# Patient Record
Sex: Female | Born: 1990 | Race: Black or African American | Hispanic: No | Marital: Single | Smoking: Never smoker
Health system: Southern US, Community
[De-identification: ages and names within clinical notes are randomized; demographics above are authoritative.]

---

## 2014-01-12 ENCOUNTER — Emergency Department (HOSPITAL_COMMUNITY): Payer: Self-pay

## 2014-01-12 ENCOUNTER — Emergency Department (HOSPITAL_COMMUNITY)
Admission: EM | Admit: 2014-01-12 | Discharge: 2014-01-12 | Disposition: A | Discharge status: Home / Self Care | Payer: Self-pay | Attending: Emergency Medicine | Admitting: Emergency Medicine

## 2014-01-12 ENCOUNTER — Encounter (HOSPITAL_COMMUNITY): Payer: Self-pay | Admitting: Emergency Medicine

## 2014-01-12 DIAGNOSIS — W2209XA Striking against other stationary object, initial encounter: Secondary | ICD-10-CM | POA: Insufficient documentation

## 2014-01-12 DIAGNOSIS — S93602A Unspecified sprain of left foot, initial encounter: Secondary | ICD-10-CM

## 2014-01-12 DIAGNOSIS — S8990XA Unspecified injury of unspecified lower leg, initial encounter: Secondary | ICD-10-CM | POA: Insufficient documentation

## 2014-01-12 DIAGNOSIS — S93609A Unspecified sprain of unspecified foot, initial encounter: Secondary | ICD-10-CM | POA: Insufficient documentation

## 2014-01-12 DIAGNOSIS — S99929A Unspecified injury of unspecified foot, initial encounter: Secondary | ICD-10-CM

## 2014-01-12 DIAGNOSIS — Y9289 Other specified places as the place of occurrence of the external cause: Secondary | ICD-10-CM | POA: Insufficient documentation

## 2014-01-12 DIAGNOSIS — Y9389 Activity, other specified: Secondary | ICD-10-CM | POA: Insufficient documentation

## 2014-01-12 DIAGNOSIS — S99919A Unspecified injury of unspecified ankle, initial encounter: Secondary | ICD-10-CM

## 2014-01-12 MED ORDER — IBUPROFEN 400 MG PO TABS
400.0000 mg | ORAL_TABLET | Freq: Once | ORAL | Status: AC
  Administered 2014-01-12: 400 mg via ORAL
  Filled 2014-01-12: qty 1

## 2014-01-12 MED ORDER — HYDROCODONE-ACETAMINOPHEN 5-325 MG PO TABS
1.0000 | ORAL_TABLET | Freq: Once | ORAL | Status: DC
  Filled 2014-01-12: qty 1

## 2014-01-12 MED ORDER — NAPROXEN SODIUM 550 MG PO TABS: 550.0000 mg | ORAL_TABLET | Freq: Two times a day (BID) | ORAL | Status: AC | PRN

## 2014-01-12 MED ORDER — METHOCARBAMOL 500 MG PO TABS: 500.0000 mg | ORAL_TABLET | Freq: Two times a day (BID) | ORAL | Status: AC

## 2014-01-12 NOTE — Discharge Instructions (Signed)
Foot Sprain The muscles and cord like structures which attach muscle to bone (tendons) that surround the feet are made up of units. A foot sprain can occur at the weakest spot in any of these units. This condition is most often caused by injury to or overuse of the foot, as from playing contact sports, or aggravating a previous injury, or from poor conditioning, or obesity. SYMPTOMS  Pain with movement of the foot.  Tenderness and swelling at the injury site.  Loss of strength is present in moderate or severe sprains. THE THREE GRADES OR SEVERITY OF FOOT SPRAIN ARE:  Mild (Grade I): Slightly pulled muscle without tearing of muscle or tendon fibers or loss of strength.  Moderate (Grade II): Tearing of fibers in a muscle, tendon, or at the attachment to bone, with small decrease in strength.  Severe (Grade III): Rupture of the muscle-tendon-bone attachment, with separation of fibers. Severe sprain requires surgical repair. Often repeating (chronic) sprains are caused by overuse. Sudden (acute) sprains are caused by direct injury or over-use. DIAGNOSIS  Diagnosis of this condition is usually by your own observation. If problems continue, a caregiver may be required for further evaluation and treatment. X-rays may be required to make sure there are not breaks in the bones (fractures) present. Continued problems may require physical therapy for treatment. PREVENTION  Use strength and conditioning exercises appropriate for your sport.  Warm up properly prior to working out.  Use athletic shoes that are made for the sport you are participating in.  Allow adequate time for healing. Early return to activities makes repeat injury more likely, and can lead to an unstable arthritic foot that can result in prolonged disability. Mild sprains generally heal in 3 to 10 days, with moderate and severe sprains taking 2 to 10 weeks. Your caregiver can help you determine the proper time required for  healing. HOME CARE INSTRUCTIONS   Apply ice to the injury for 15-20 minutes, 03-04 times per day. Put the ice in a plastic bag and place a towel between the bag of ice and your skin.  An elastic wrap (like an Ace bandage) may be used to keep swelling down.  Keep foot above the level of the heart, or at least raised on a footstool, when swelling and pain are present.  Try to avoid use other than gentle range of motion while the foot is painful. Do not resume use until instructed by your caregiver. Then begin use gradually, not increasing use to the point of pain. If pain does develop, decrease use and continue the above measures, gradually increasing activities that do not cause discomfort, until you gradually achieve normal use.  Use crutches if and as instructed, and for the length of time instructed.  Keep injured foot and ankle wrapped between treatments.  Massage foot and ankle for comfort and to keep swelling down. Massage from the toes up towards the knee.  Only take over-the-counter or prescription medicines for pain, discomfort, or fever as directed by your caregiver. SEEK IMMEDIATE MEDICAL CARE IF:   Your pain and swelling increase, or pain is not controlled with medications.  You have loss of feeling in your foot or your foot turns cold or blue.  You develop new, unexplained symptoms, or an increase of the symptoms that brought you to your caregiver. MAKE SURE YOU:   Understand these instructions.  Will watch your condition.  Will get help right away if you are not doing well or get worse. Document Released:   10/19/2001 Document Revised: 07/22/2011 Document Reviewed: 12/17/2007 [employer] Patient Information 2015 [employer]. This information is not intended to replace advice given to you by your health care provider. Make sure you discuss any questions you have with your health care provider.  

## 2014-01-12 NOTE — ED Notes (Signed)
Pt presents for evaluation of left foot swelling and tenderness.  Pt reports "working out hard on it" for the past 3 days and then tripping off a curb tonight.  Pedal pulse strong, swelling noted to foot.  Pt ambulatory from triage.

## 2014-01-12 NOTE — ED Provider Notes (Signed)
Medical screening examination/treatment/procedure(s) were performed by non-physician practitioner and as supervising physician I was immediately available for consultation/collaboration.   EKG Interpretation None       Olivia Mackie, MD 01/12/14 [phone]

## 2014-01-12 NOTE — ED Provider Notes (Signed)
CSN: [id number]     Arrival date & time 01/12/14  0034 History   First MD Initiated Contact with Patient 01/12/14 0050     Chief Complaint  Patient presents with  . Foot Pain     (Consider location/radiation/quality/duration/timing/severity/associated sxs/prior Treatment) HPI  23 year old female presents c/o L foot pain.  Pt sts she has been having mild L foot discomfort for the past few days.  sts "i have been working out hard and have been abusing my feet".  Only mild discomfort to both feet but today she accidentally striking L foot against the curb around 9pm.  Since then she has had increasing pain to L foot, sharp, throbbing and associated swelling.  She tries heat/ice, NSAIDs with some improvement.  However, pain is now keeping her awake. Denies numbness, ankle or knee pain.  No other complaints. Able to ambulate.     History reviewed. No pertinent past medical history. History reviewed. No pertinent past surgical history. No family history on file. History  Substance Use Topics  . Smoking status: Never Smoker   . Smokeless tobacco: Not on file  . Alcohol Use: No   OB History   Grav Para Term Preterm Abortions TAB SAB Ect Mult Living                 Review of Systems  Constitutional: Negative for fever.  Musculoskeletal: Positive for arthralgias.  Skin: Negative for rash and wound.  Neurological: Negative for numbness.      Allergies  Peanut-containing drug products  Home Medications   Prior to Admission medications   Medication Sig Start Date End Date Taking? Authorizing Provider  naproxen sodium (ANAPROX) 220 MG tablet Take 220 mg by mouth 2 (two) times daily as needed (for pain).   Yes Historical Provider, MD   BP 134/93  Pulse 80  Temp(Src) 98.1 F (36.7 C) (Oral)  Resp 20  Ht  (1.702 m)  Wt 240 lb (108.863 kg)  BMI 37.58 kg/m2  SpO2 99% Physical Exam  Nursing note and vitals reviewed. Constitutional: She appears well-developed and  well-nourished. No distress.  HENT:  Head: Atraumatic.  Eyes: Conjunctivae are normal.  Neck: Neck supple.  Musculoskeletal: She exhibits tenderness (L foot: tenderness to proximal medial foot on palpation with small palpable cord, no erythema no rash and no deformity.  L ankle with FROM, pedal pulse palpable, brisk cap refill.  ).  Neurological: She is alert.  Skin: No rash noted.  Psychiatric: She has a normal mood and affect.    ED Course  Procedures (including critical care time)  1:09 AM L foot injury, xray ordered.  Liikely a sprain.    1:46 AM Xray neg for acute fx.  ACE wrap applied.  Rice therapy discussed, ortho referral as needed.  Stable for discharge.  Pt able to ambulate.  NVI.  Labs Review Labs Reviewed - No data to display  Imaging Review Dg Foot Complete Left  01/12/2014   CLINICAL DATA:  FOOT PAIN a  EXAM: LEFT FOOT - COMPLETE 3+ VIEW  COMPARISON:  None.  FINDINGS: There is no evidence of fracture or dislocation. There is no evidence of arthropathy or other focal bone abnormality. Soft tissues are unremarkable.  IMPRESSION: Negative.   Electronically Signed   By: Oley Balm M.D.   On: 01/12/2014 01:41     EKG Interpretation None      MDM   Final diagnoses:  Foot sprain, left, initial encounter  BP 134/93  Pulse 80  Temp(Src) 98.1 F (36.7 C) (Oral)  Resp 20  Ht  (1.702 m)  Wt 240 lb (108.863 kg)  BMI 37.58 kg/m2  SpO2 99%  LMP 12/12/2013  I have reviewed nursing notes and vital signs. I personally reviewed the imaging tests through PACS system  I reviewed available ER/hospitalization records thought the EMR     Fayrene Helper, [place] 01/12/14 0147

## 2014-05-19 ENCOUNTER — Ambulatory Visit: Payer: Self-pay

## 2014-08-15 ENCOUNTER — Emergency Department (HOSPITAL_COMMUNITY)
Admission: EM | Admit: 2014-08-15 | Discharge: 2014-08-15 | Disposition: A | Discharge status: Home / Self Care | Payer: Self-pay | Attending: Emergency Medicine | Admitting: Emergency Medicine

## 2014-08-15 ENCOUNTER — Emergency Department (HOSPITAL_COMMUNITY): Payer: Self-pay

## 2014-08-15 ENCOUNTER — Encounter (HOSPITAL_COMMUNITY): Payer: Self-pay

## 2014-08-15 DIAGNOSIS — Z791 Long term (current) use of non-steroidal anti-inflammatories (NSAID): Secondary | ICD-10-CM | POA: Insufficient documentation

## 2014-08-15 DIAGNOSIS — R1110 Vomiting, unspecified: Secondary | ICD-10-CM | POA: Insufficient documentation

## 2014-08-15 DIAGNOSIS — N939 Abnormal uterine and vaginal bleeding, unspecified: Secondary | ICD-10-CM

## 2014-08-15 DIAGNOSIS — N938 Other specified abnormal uterine and vaginal bleeding: Secondary | ICD-10-CM | POA: Insufficient documentation

## 2014-08-15 DIAGNOSIS — R1031 Right lower quadrant pain: Secondary | ICD-10-CM | POA: Insufficient documentation

## 2014-08-15 LAB — COMPREHENSIVE METABOLIC PANEL
ALT: 20 U/L (ref 0–35)
ANION GAP: 8 (ref 5–15)
AST: 21 U/L (ref 0–37)
Albumin: 3.6 g/dL (ref 3.5–5.2)
Alkaline Phosphatase: 45 U/L (ref 39–117)
BILIRUBIN TOTAL: 0.5 mg/dL (ref 0.3–1.2)
BUN: 11 mg/dL (ref 6–23)
CALCIUM: 9.1 mg/dL (ref 8.4–10.5)
CO2: 23 mmol/L (ref 19–32)
CREATININE: 0.86 mg/dL (ref 0.50–1.10)
Chloride: 109 mmol/L (ref 96–112)
GFR calc Af Amer: >90 mL/min (ref >90)
Glucose, Bld: 135 mg/dL — ABNORMAL HIGH (ref 70–99)
Potassium: 4.1 mmol/L (ref 3.5–5.1)
Sodium: 140 mmol/L (ref 135–145)
Total Protein: 6.8 g/dL (ref 6.0–8.3)

## 2014-08-15 LAB — CBC WITH DIFFERENTIAL/PLATELET
Basophils Absolute: 0 10*3/uL (ref 0.0–0.1)
Basophils Relative: 0 % (ref 0–1)
EOS PCT: 3 % (ref 0–5)
Eosinophils Absolute: 0.2 10*3/uL (ref 0.0–0.7)
HCT: 41.2 % (ref 36.0–46.0)
Hemoglobin: 14.1 g/dL (ref 12.0–15.0)
LYMPHS PCT: 28 % (ref 12–46)
Lymphs Abs: 2.6 10*3/uL (ref 0.7–4.0)
MCH: 29.4 pg (ref 26.0–34.0)
MCHC: 34.2 g/dL (ref 30.0–36.0)
MCV: 85.8 fL (ref 78.0–100.0)
MONO ABS: 0.6 10*3/uL (ref 0.1–1.0)
MONOS PCT: 6 % (ref 3–12)
NEUTROS ABS: 6.1 10*3/uL (ref 1.7–7.7)
Neutrophils Relative %: 63 % (ref 43–77)
Platelets: 198 10*3/uL (ref 150–400)
RBC: 4.8 MIL/uL (ref 3.87–5.11)
RDW: 12.4 % (ref 11.5–15.5)
WBC: 9.5 10*3/uL (ref 4.0–10.5)

## 2014-08-15 LAB — URINE MICROSCOPIC-ADD ON

## 2014-08-15 LAB — PREGNANCY, URINE: Preg Test, Ur: NEGATIVE

## 2014-08-15 LAB — WET PREP, GENITAL
TRICH WET PREP: NONE SEEN
YEAST WET PREP: NONE SEEN

## 2014-08-15 LAB — URINALYSIS, ROUTINE W REFLEX MICROSCOPIC
Bilirubin Urine: NEGATIVE
Glucose, UA: NEGATIVE mg/dL
Ketones, ur: NEGATIVE mg/dL
LEUKOCYTES UA: NEGATIVE
Nitrite: NEGATIVE
Protein, ur: NEGATIVE mg/dL
SPECIFIC GRAVITY, URINE: 1.026 (ref 1.005–1.030)
Urobilinogen, UA: 1 mg/dL (ref 0.0–1.0)
pH: 6 (ref 5.0–8.0)

## 2014-08-15 LAB — GC/CHLAMYDIA PROBE AMP (~~LOC~~) NOT AT ARMC
Chlamydia: NEGATIVE
Neisseria Gonorrhea: NEGATIVE

## 2014-08-15 LAB — HIV ANTIBODY (ROUTINE TESTING W REFLEX): HIV SCREEN 4TH GENERATION: NONREACTIVE

## 2014-08-15 MED ORDER — ONDANSETRON 4 MG PO TBDP
4.0000 mg | ORAL_TABLET | Freq: Once | ORAL | Status: AC
  Administered 2014-08-15: 4 mg via ORAL
  Filled 2014-08-15: qty 1

## 2014-08-15 MED ORDER — SODIUM CHLORIDE 0.9 % IV BOLUS (SEPSIS)
1000.0000 mL | Freq: Once | INTRAVENOUS | Status: AC
  Administered 2014-08-15: 1000 mL via INTRAVENOUS

## 2014-08-15 NOTE — Discharge Instructions (Signed)
Ibuprofen 600 mg every 6 hours as needed for pain.  We will call you if your cultures indicate you require further treatment.  Return to the emergency department if your symptoms significantly worsen or change. Be sure to follow-up with your primary Dr. in the next week if not improving.   Abdominal Pain Many things can cause abdominal pain. Usually, abdominal pain is not caused by a disease and will improve without treatment. It can often be observed and treated at home. Your health care provider will do a physical exam and possibly order blood tests and X-rays to help determine the seriousness of your pain. However, in many cases, more time must pass before a clear cause of the pain can be found. Before that point, your health care provider may not know if you need more testing or further treatment. HOME CARE INSTRUCTIONS  Monitor your abdominal pain for any changes. The following actions may help to alleviate any discomfort you are experiencing:  Only take over-the-counter or prescription medicines as directed by your health care provider.  Do not take laxatives unless directed to do so by your health care provider.  Try a clear liquid diet (broth, tea, or water) as directed by your health care provider. Slowly move to a bland diet as tolerated. SEEK MEDICAL CARE IF:  You have unexplained abdominal pain.  You have abdominal pain associated with nausea or diarrhea.  You have pain when you urinate or have a bowel movement.  You experience abdominal pain that wakes you in the night.  You have abdominal pain that is worsened or improved by eating food.  You have abdominal pain that is worsened with eating fatty foods.  You have a fever. SEEK IMMEDIATE MEDICAL CARE IF:   Your pain does not go away within 2 hours.  You keep throwing up (vomiting).  Your pain is felt only in portions of the abdomen, such as the right side or the left lower portion of the abdomen.  You pass bloody  or black tarry stools. MAKE SURE YOU:  Understand these instructions.   Will watch your condition.   Will get help right away if you are not doing well or get worse.  Document Released: 02/06/2005 Document Revised: 05/04/2013 Document Reviewed: 01/06/2013 [facility] Patient Information 2015 [place], [place]. This information is not intended to replace advice given to you by your health care provider. Make sure you discuss any questions you have with your health care provider.  Dysfunctional Uterine Bleeding Normally, menstrual periods begin between ages 5911 to 6417 in young women. A normal menstrual cycle/period may begin every 23 days up to 35 days and lasts from 1 to 7 days. Around 12 to 14 days before your menstrual period starts, ovulation (ovary produces an egg) occurs. When counting the time between menstrual periods, count from the first day of bleeding of the previous period to the first day of bleeding of the next period. Dysfunctional (abnormal) uterine bleeding is bleeding that is different from a normal menstrual period. Your periods may come earlier or later than usual. They may be lighter, have blood clots or be heavier. You may have bleeding between periods, or you may skip one period or more. You may have bleeding after sexual intercourse, bleeding after menopause, or no menstrual period. CAUSES   Pregnancy (normal, miscarriage, tubal).  IUDs (intrauterine device, birth control).  Birth control pills.  Hormone treatment.  Menopause.  Infection of the cervix.  Blood clotting problems.  Infection of the inside  lining of the uterus.  Endometriosis, inside lining of the uterus growing in the pelvis and other female organs.  Adhesions (scar tissue) inside the uterus.  Obesity or severe weight loss.  Uterine polyps inside the uterus.  Cancer of the vagina, cervix, or uterus.  Ovarian cysts or polycystic ovary syndrome.  Medical problems (diabetes, thyroid  disease).  Uterine fibroids (noncancerous tumor).  Problems with your female hormones.  Endometrial hyperplasia, very thick lining and enlarged cells inside of the uterus.  Medicines that interfere with ovulation.  Radiation to the pelvis or abdomen.  Chemotherapy. DIAGNOSIS   Your doctor will discuss the history of your menstrual periods, medicines you are taking, changes in your weight, stress in your life, and any medical problems you may have.  Your doctor will do a physical and pelvic examination.  Your doctor may want to perform certain tests to make a diagnosis, such as:  Pap test.  Blood tests.  Cultures for infection.  CT scan.  Ultrasound.  Hysteroscopy.  Laparoscopy.  MRI.  Hysterosalpingography.  D and C.  Endometrial biopsy. TREATMENT  Treatment will depend on the cause of the dysfunctional uterine bleeding (DUB). Treatment may include:  Observing your menstrual periods for a couple of months.  Prescribing medicines for medical problems, including:  Antibiotics.  Hormones.  Birth control pills.  Removing an IUD (intrauterine device, birth control).  Surgery:  D and C (scrape and remove tissue from inside the uterus).  Laparoscopy (examine inside the abdomen with a lighted tube).  Uterine ablation (destroy lining of the uterus with electrical current, laser, heat, or freezing).  Hysteroscopy (examine cervix and uterus with a lighted tube).  Hysterectomy (remove the uterus). HOME CARE INSTRUCTIONS   If medicines were prescribed, take exactly as directed. Do not change or switch medicines without consulting your caregiver.  Long term heavy bleeding may result in iron deficiency. Your caregiver may have prescribed iron pills. They help replace the iron that your body lost from heavy bleeding. Take exactly as directed.  Do not take aspirin or medicines that contain aspirin one week before or during your menstrual period. Aspirin may  make the bleeding worse.  If you need to change your sanitary pad or tampon more than once every 2 hours, stay in bed with your feet elevated and a cold pack on your lower abdomen. Rest as much as possible, until the bleeding stops or slows down.  Eat well-balanced meals. Eat foods high in iron. Examples are:  Leafy green vegetables.  Whole-grain breads and cereals.  Eggs.  Meat.  Liver.  Do not try to lose weight until the abnormal bleeding has stopped and your blood iron level is back to normal. Do not lift more than ten pounds or do strenuous activities when you are bleeding.  For a couple of months, make note on your calendar, marking the start and ending of your period, and the type of bleeding (light, medium, heavy, spotting, clots or missed periods). This is for your caregiver to better evaluate your problem. SEEK MEDICAL CARE IF:   You develop nausea (feeling sick to your stomach) and vomiting, dizziness, or diarrhea while you are taking your medicine.  You are getting lightheaded or weak.  You have any problems that may be related to the medicine you are taking.  You develop pain with your DUB.  You want to remove your IUD.  You want to stop or change your birth control pills or hormones.  You have any type of  abnormal bleeding mentioned above.  You are over 70 years old and have not had a menstrual period yet.  You are 24 years old and you are still having menstrual periods.  You have any of the symptoms mentioned above.  You develop a rash. SEEK IMMEDIATE MEDICAL CARE IF:   An oral temperature above 102 F (38.9 C) develops.  You develop chills.  You are changing your sanitary pad or tampon more than once an hour.  You develop abdominal pain.  You pass out or faint. Document Released: 04/26/2000 Document Revised: 07/22/2011 Document Reviewed: 03/28/2009 [facility] Patient Information 2015 [place], [place]. This information is not intended to replace  advice given to you by your health care provider. Make sure you discuss any questions you have with your health care provider.

## 2014-08-15 NOTE — ED Notes (Signed)
Returned from ultrasound.

## 2014-08-15 NOTE — ED Notes (Signed)
Pt c/o of new onset emesis x 2 ~ 3am. Reports 1 episode of "coughing up blood." Pain in the lower R abdomen and one episode of vaginal bleeding tonight.

## 2014-08-15 NOTE — ED Notes (Signed)
Patient transported to Ultrasound 

## 2014-08-15 NOTE — ED Provider Notes (Signed)
CSN: [id number]     Arrival date & time 08/15/14  0431 History   First MD Initiated Contact with Patient 08/15/14 0440     Chief Complaint  Patient presents with  . Hemoptysis  . Vaginal Bleeding     (Consider location/radiation/quality/duration/timing/severity/associated sxs/prior Treatment) HPI Comments: Patient is a 24 year old female with history of uterine fibroids. She presents for evaluation of vaginal bleeding and right lower quadrant pain which was present upon waking from sleep this morning approximately 30 minutes ago. She reports one episode of vomiting. She denies any fevers or chills. She is sexually active with one partner with whom she has been in a monogamous relationship with.  Patient is a 24 y.o. female presenting with vaginal bleeding. The history is provided by the patient.  Vaginal Bleeding Quality:  Dark red Severity:  Moderate Onset quality:  Sudden Duration: 30 minutes ago. Timing:  Constant Progression:  Partially resolved Chronicity:  New Menstrual history:  Regular Possible pregnancy: yes   Relieved by:  Nothing Worsened by:  Nothing tried Ineffective treatments:  None tried Associated symptoms: abdominal pain and nausea   Associated symptoms: no dysuria and no fever     History reviewed. No pertinent past medical history. History reviewed. No pertinent past surgical history. History reviewed. No pertinent family history. History  Substance Use Topics  . Smoking status: Never Smoker   . Smokeless tobacco: Not on file  . Alcohol Use: No   OB History    No data available     Review of Systems  Constitutional: Negative for fever.  Gastrointestinal: Positive for nausea and abdominal pain.  Genitourinary: Positive for vaginal bleeding. Negative for dysuria.  All other systems reviewed and are negative.     Allergies  Peanut-containing drug products  Home Medications   Prior to Admission medications   Medication Sig Start Date End Date  Taking? Authorizing Provider  methocarbamol (ROBAXIN) 500 MG tablet Take 1 tablet (500 mg total) by mouth 2 (two) times daily. 01/12/14   Fayrene Helper, PA-C  naproxen sodium (ANAPROX) 550 MG tablet Take 1 tablet (550 mg total) by mouth 2 (two) times daily as needed (for pain). 01/12/14   Fayrene Helper, PA-C   There were no vitals taken for this visit. Physical Exam  Constitutional: She is oriented to person, place, and time. She appears well-developed and well-nourished. No distress.  HENT:  Head: Normocephalic and atraumatic.  Neck: Normal range of motion. Neck supple.  Cardiovascular: Normal rate and regular rhythm.  Exam reveals no gallop and no friction rub.   No murmur heard. Pulmonary/Chest: Effort normal and breath sounds normal. No respiratory distress. She has no wheezes.  Abdominal: Soft. Bowel sounds are normal. She exhibits no distension. There is tenderness. There is no rebound and no guarding.  There is tenderness to palpation in the right lower quadrant.  Genitourinary: Uterus normal. Vaginal discharge found.  There is a slight bloody discharge present. There are no adnexal masses or tenderness. There is no cervical motion tenderness.  Musculoskeletal: Normal range of motion.  Neurological: She is alert and oriented to person, place, and time.  Skin: Skin is warm and dry. She is not diaphoretic.  Nursing note and vitals reviewed.   ED Course  Procedures (including critical care time) Labs Review Labs Reviewed  WET PREP, GENITAL  URINALYSIS, ROUTINE W REFLEX MICROSCOPIC  PREGNANCY, URINE  COMPREHENSIVE METABOLIC PANEL  CBC WITH DIFFERENTIAL/PLATELET  HIV ANTIBODY (ROUTINE TESTING)  GC/CHLAMYDIA PROBE AMP ([place])  Imaging Review No results found.   EKG Interpretation None      MDM   Final diagnoses:  None    Patient presents with lower abdominal discomfort, vaginal bleeding, and cough that started approximately 30 minutes prior to arrival. Her abdomen is  very benign and workup is very unremarkable. As she presented here with a very short duration of symptoms and she appears very comfortable, I am reluctant to perform any further imaging studies. Her ultrasound reveals nothing significant and I believe her to be appropriate for discharge.    Geoffery Lyonsouglas Daril Warga, MD 08/16/14 [phone]

## 2016-05-26 IMAGING — US US PELVIS COMPLETE
1 series · 14 of 25 positions shown · non-contrast
Comparison: None

CLINICAL DATA: Vomiting, RIGHT lower quadrant pain. Last menstrual
period August 05, 2014

EXAM:
TRANSABDOMINAL AND TRANSVAGINAL ULTRASOUND OF PELVIS
TECHNIQUE: Both transabdominal and transvaginal ultrasound examinations of the
pelvis were performed. Transabdominal technique was performed for
global imaging of the pelvis including uterus, ovaries, adnexal
regions, and pelvic cul-de-sac. It was necessary to proceed with
endovaginal exam following the transabdominal exam to visualize the
endometrium and ovaries.

[Series 1: us pelvis complete · 0.21mm/px · 14 of 75 slices shown]
[im 1/75]
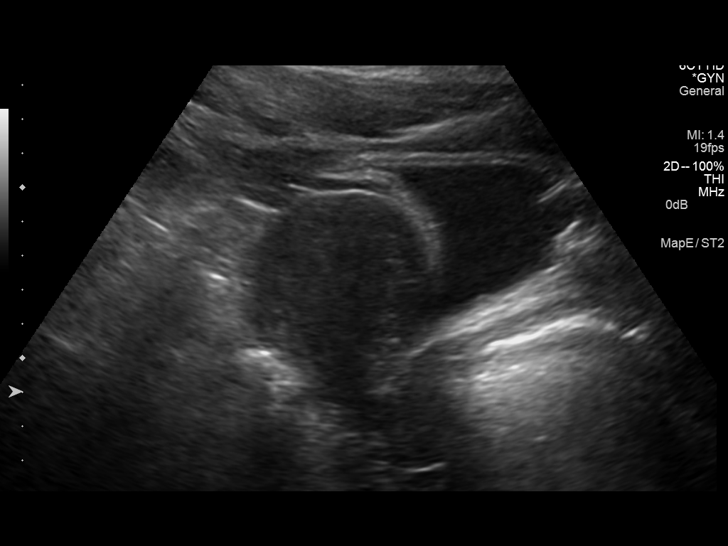
[im 7/75]
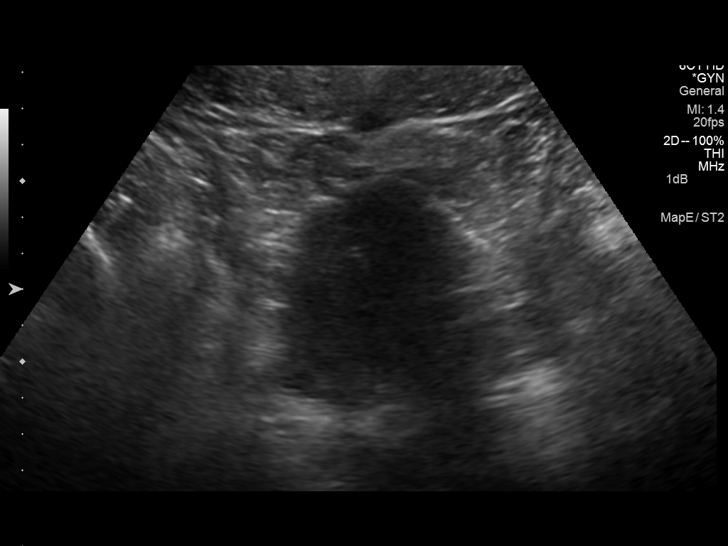
[im 13/75]
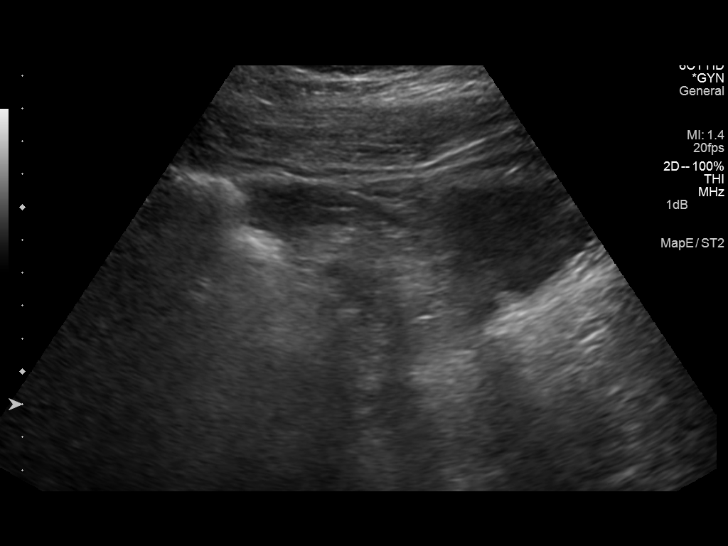
[im 19/75]
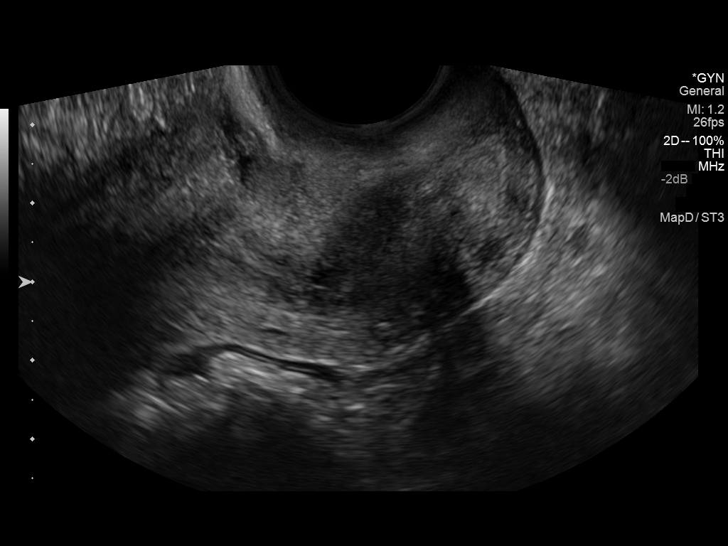
[im 25/75]
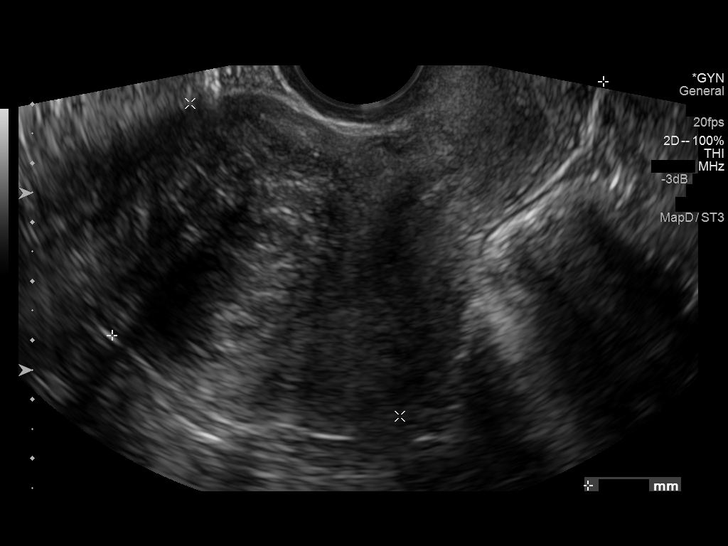
[im 28/75]
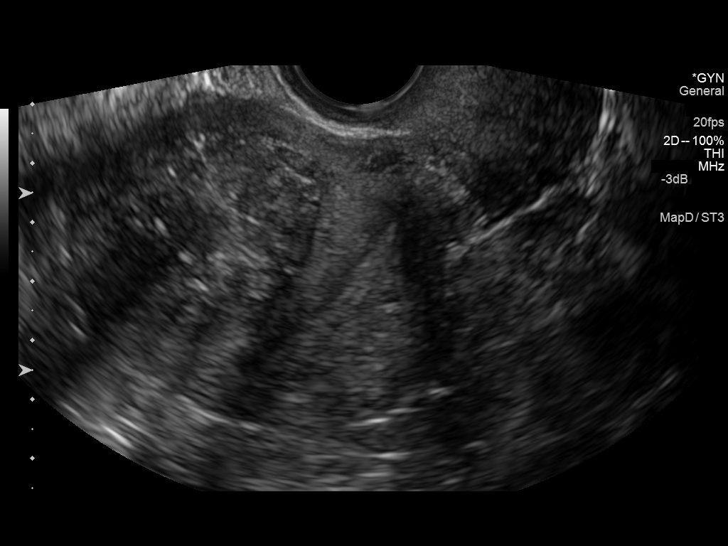
[im 34/75]
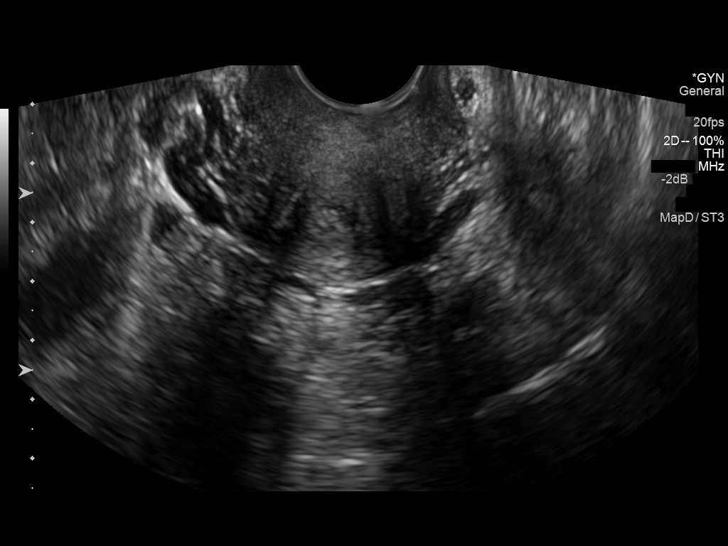
[im 41/75]
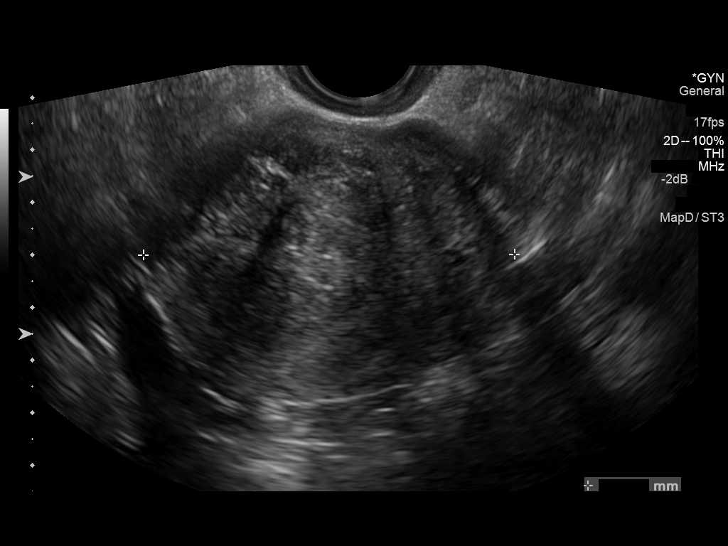
[im 47/75]
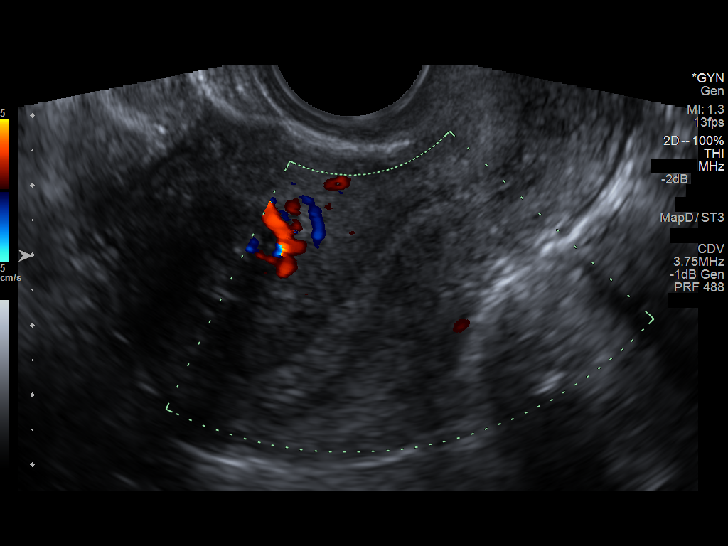
[im 50/75]
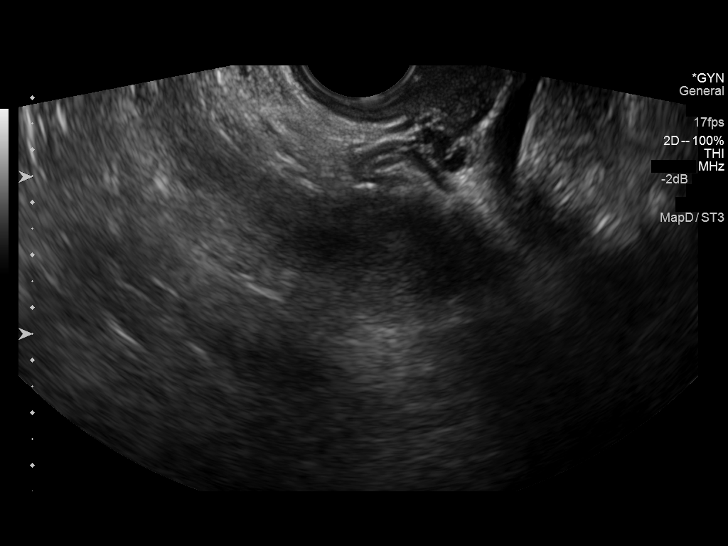
[im 56/75]
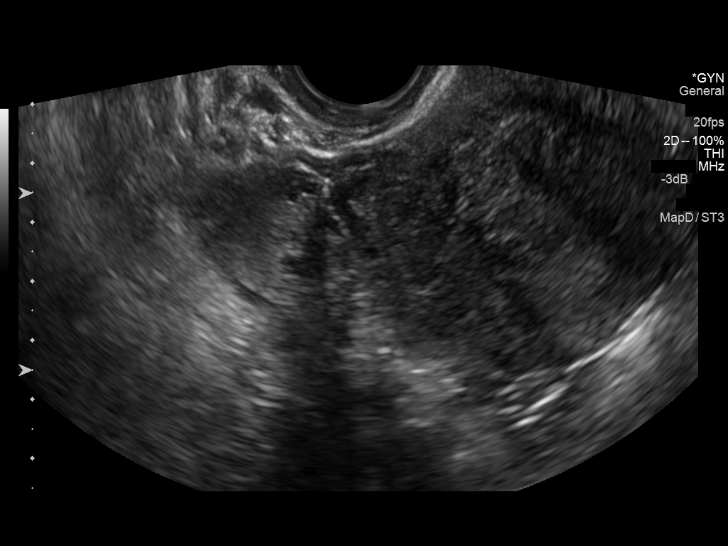
[im 62/75]
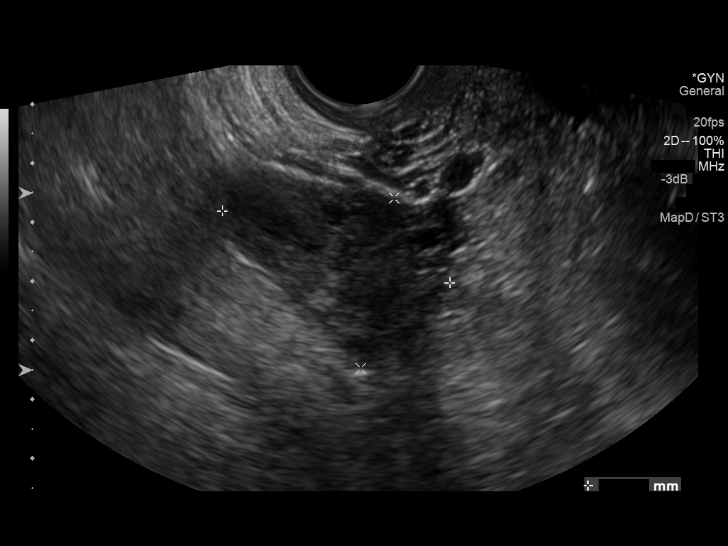
[im 68/75]
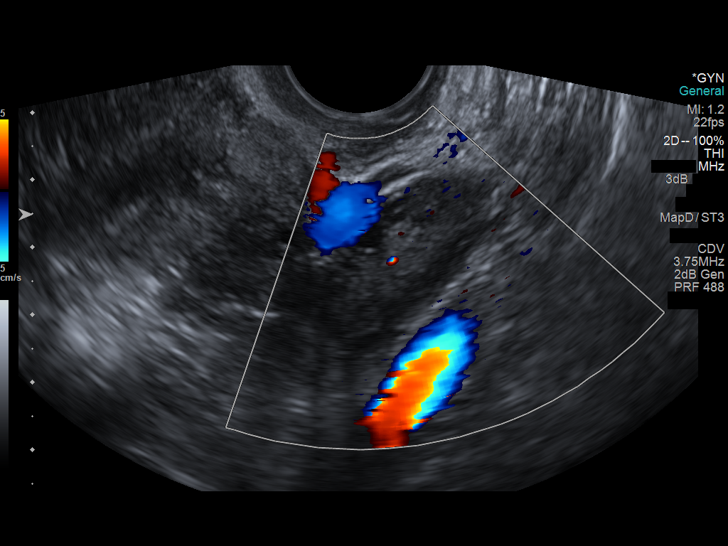
[im 75/75]
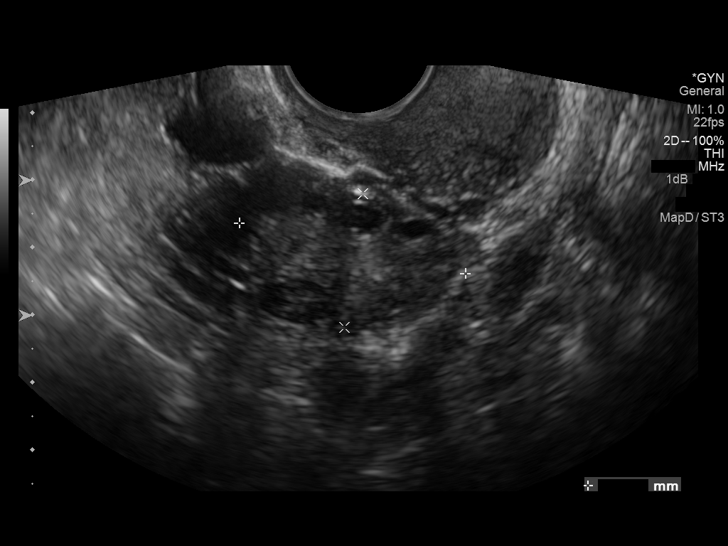

[14 of 25 positions shown; findings below may reference images not displayed]

FINDINGS: Uterus

Measurements: 9.4 x 6.4 x 7.1 cm. Hypoechoic shadowing 4.6 x 4 x
cm fundal intramural leiomyoma .

Endometrium

Thickness: 8 mm.  No focal abnormality visualized.

Right ovary

Measurements: 4 x 2.9 x 2.2 cm. Normal appearance/no adnexal mass.

Left ovary

Measurements: 3.4 x 2 x 2 cm. Normal appearance/no adnexal mass.

Other findings

No free fluid.
IMPRESSION: 4.6 x 4 x 4.6 cm fundal intramural leiomyoma without acute pelvic
process.

  By: Petat Ruddin
